# Patient Record
Sex: Male | Born: 1980 | Race: Black or African American | Hispanic: No | Marital: Single | Smoking: Current every day smoker
Health system: Southern US, Community
[De-identification: ages and names within clinical notes are randomized; demographics above are authoritative.]

## PROBLEM LIST (undated history)

## (undated) DIAGNOSIS — S73005A Unspecified dislocation of left hip, initial encounter: Secondary | ICD-10-CM

## (undated) DIAGNOSIS — S72059A Unspecified fracture of head of unspecified femur, initial encounter for closed fracture: Secondary | ICD-10-CM

## (undated) HISTORY — PX: FRACTURE SURGERY: SHX138

---

## 2017-10-29 ENCOUNTER — Other Ambulatory Visit: Payer: Self-pay

## 2017-10-29 ENCOUNTER — Emergency Department (HOSPITAL_COMMUNITY): Payer: No Typology Code available for payment source

## 2017-10-29 ENCOUNTER — Inpatient Hospital Stay (HOSPITAL_COMMUNITY)
Admission: EM | Admit: 2017-10-29 | Discharge: 2017-11-03 | DRG: 958 | Disposition: A | Discharge status: Home Health | Payer: No Typology Code available for payment source | Attending: General Surgery | Admitting: General Surgery

## 2017-10-29 ENCOUNTER — Encounter (HOSPITAL_COMMUNITY): Payer: Self-pay | Admitting: *Deleted

## 2017-10-29 DIAGNOSIS — S99919A Unspecified injury of unspecified ankle, initial encounter: Secondary | ICD-10-CM

## 2017-10-29 DIAGNOSIS — D62 Acute posthemorrhagic anemia: Secondary | ICD-10-CM | POA: Diagnosis not present

## 2017-10-29 DIAGNOSIS — T148XXA Other injury of unspecified body region, initial encounter: Secondary | ICD-10-CM

## 2017-10-29 DIAGNOSIS — S72052A Unspecified fracture of head of left femur, initial encounter for closed fracture: Secondary | ICD-10-CM | POA: Diagnosis present

## 2017-10-29 DIAGNOSIS — Z419 Encounter for procedure for purposes other than remedying health state, unspecified: Secondary | ICD-10-CM

## 2017-10-29 DIAGNOSIS — F17290 Nicotine dependence, other tobacco product, uncomplicated: Secondary | ICD-10-CM | POA: Diagnosis present

## 2017-10-29 DIAGNOSIS — S73005A Unspecified dislocation of left hip, initial encounter: Secondary | ICD-10-CM

## 2017-10-29 DIAGNOSIS — S0181XA Laceration without foreign body of other part of head, initial encounter: Secondary | ICD-10-CM

## 2017-10-29 DIAGNOSIS — Y92410 Unspecified street and highway as the place of occurrence of the external cause: Secondary | ICD-10-CM

## 2017-10-29 DIAGNOSIS — S0101XA Laceration without foreign body of scalp, initial encounter: Secondary | ICD-10-CM | POA: Diagnosis present

## 2017-10-29 DIAGNOSIS — S020XXA Fracture of vault of skull, initial encounter for closed fracture: Secondary | ICD-10-CM | POA: Diagnosis present

## 2017-10-29 DIAGNOSIS — S32402A Unspecified fracture of left acetabulum, initial encounter for closed fracture: Secondary | ICD-10-CM | POA: Diagnosis present

## 2017-10-29 DIAGNOSIS — S27322A Contusion of lung, bilateral, initial encounter: Secondary | ICD-10-CM | POA: Diagnosis present

## 2017-10-29 DIAGNOSIS — S51812A Laceration without foreign body of left forearm, initial encounter: Secondary | ICD-10-CM

## 2017-10-29 DIAGNOSIS — M25552 Pain in left hip: Secondary | ICD-10-CM | POA: Diagnosis present

## 2017-10-29 DIAGNOSIS — S72009A Fracture of unspecified part of neck of unspecified femur, initial encounter for closed fracture: Secondary | ICD-10-CM

## 2017-10-29 DIAGNOSIS — S99929A Unspecified injury of unspecified foot, initial encounter: Secondary | ICD-10-CM

## 2017-10-29 DIAGNOSIS — S72059A Unspecified fracture of head of unspecified femur, initial encounter for closed fracture: Secondary | ICD-10-CM | POA: Diagnosis present

## 2017-10-29 DIAGNOSIS — S32422A Displaced fracture of posterior wall of left acetabulum, initial encounter for closed fracture: Principal | ICD-10-CM

## 2017-10-29 HISTORY — DX: Unspecified fracture of head of unspecified femur, initial encounter for closed fracture: S72.059A

## 2017-10-29 HISTORY — DX: Unspecified dislocation of left hip, initial encounter: S73.005A

## 2017-10-29 LAB — I-STAT CHEM 8, ED
BUN: 13 mg/dL (ref 6–20)
CALCIUM ION: 1.12 mmol/L — AB (ref 1.15–1.40)
CREATININE: 1.4 mg/dL — AB (ref 0.61–1.24)
Chloride: 100 mmol/L — ABNORMAL LOW (ref 101–111)
GLUCOSE: 141 mg/dL — AB (ref 65–99)
HCT: 38 % — ABNORMAL LOW (ref 39.0–52.0)
HEMOGLOBIN: 12.9 g/dL — AB (ref 13.0–17.0)
Potassium: 3.6 mmol/L (ref 3.5–5.1)
Sodium: 142 mmol/L (ref 135–145)
TCO2: 30 mmol/L (ref 22–32)

## 2017-10-29 LAB — COMPREHENSIVE METABOLIC PANEL
ALT: 23 U/L (ref 17–63)
ANION GAP: 12 (ref 5–15)
AST: 39 U/L (ref 15–41)
Albumin: 3.7 g/dL (ref 3.5–5.0)
Alkaline Phosphatase: 48 U/L (ref 38–126)
BUN: 11 mg/dL (ref 6–20)
CHLORIDE: 102 mmol/L (ref 101–111)
CO2: 25 mmol/L (ref 22–32)
CREATININE: 1.46 mg/dL — AB (ref 0.61–1.24)
Calcium: 8.6 mg/dL — ABNORMAL LOW (ref 8.9–10.3)
Glucose, Bld: 143 mg/dL — ABNORMAL HIGH (ref 65–99)
POTASSIUM: 3.9 mmol/L (ref 3.5–5.1)
SODIUM: 139 mmol/L (ref 135–145)
Total Bilirubin: 0.9 mg/dL (ref 0.3–1.2)
Total Protein: 6.4 g/dL — ABNORMAL LOW (ref 6.5–8.1)

## 2017-10-29 LAB — ETHANOL: Alcohol, Ethyl (B): <10 mg/dL (ref <10)

## 2017-10-29 LAB — PROTIME-INR
INR: 0.95
PROTHROMBIN TIME: 12.6 s (ref 11.4–15.2)

## 2017-10-29 LAB — CBC
HCT: 38.3 % — ABNORMAL LOW (ref 39.0–52.0)
HEMOGLOBIN: 12.9 g/dL — AB (ref 13.0–17.0)
MCH: 27.6 pg (ref 26.0–34.0)
MCHC: 33.7 g/dL (ref 30.0–36.0)
MCV: 81.8 fL (ref 78.0–100.0)
PLATELETS: 204 10*3/uL (ref 150–400)
RBC: 4.68 MIL/uL (ref 4.22–5.81)
RDW: 13.2 % (ref 11.5–15.5)
WBC: 9.2 10*3/uL (ref 4.0–10.5)

## 2017-10-29 LAB — I-STAT CG4 LACTIC ACID, ED: Lactic Acid, Venous: 2.79 mmol/L (ref 0.5–1.9)

## 2017-10-29 LAB — SAMPLE TO BLOOD BANK

## 2017-10-29 MED ORDER — ETOMIDATE 2 MG/ML IV SOLN
10.0000 mg | Freq: Once | INTRAVENOUS | Status: AC
  Administered 2017-10-29: 22:00:00 via INTRAVENOUS

## 2017-10-29 MED ORDER — SODIUM CHLORIDE 0.9 % IV BOLUS (SEPSIS)
1000.0000 mL | Freq: Once | INTRAVENOUS | Status: AC
  Administered 2017-10-29: 1000 mL via INTRAVENOUS

## 2017-10-29 MED ORDER — FENTANYL CITRATE (PF) 100 MCG/2ML IJ SOLN
INTRAMUSCULAR | Status: AC
  Administered 2017-10-29: 100 ug
  Filled 2017-10-29: qty 2

## 2017-10-29 MED ORDER — IOPAMIDOL (ISOVUE-300) INJECTION 61%
INTRAVENOUS | Status: AC
  Administered 2017-10-29: 100 mL
  Filled 2017-10-29: qty 100

## 2017-10-29 MED ORDER — FENTANYL CITRATE 100 MCG BU TABS: 100.0000 ug | ORAL_TABLET | BUCCAL | Status: DC | PRN

## 2017-10-29 MED ORDER — FENTANYL CITRATE (PF) 100 MCG/2ML IJ SOLN: 100.0000 ug | INTRAMUSCULAR | Status: DC | PRN

## 2017-10-29 MED ORDER — LIDOCAINE-EPINEPHRINE (PF) 2 %-1:200000 IJ SOLN
10.0000 mL | Freq: Once | INTRAMUSCULAR | Status: AC
  Administered 2017-10-30: 10 mL

## 2017-10-29 NOTE — ED Notes (Signed)
etomadate 10mg  iv dr Rubin Payorpickering

## 2017-10-29 NOTE — ED Notes (Signed)
Etomidate 10mg  iv per dr Rubin Payorpickering  Lt hip dislocation reduced dr Rubin Payorpickering and dr Aundria Rudrogers

## 2017-10-29 NOTE — ED Notes (Signed)
Copy of lactic acid given to Dr Rubin PayorPickering 2.79

## 2017-10-29 NOTE — ED Notes (Signed)
Dr Jearld Fentonbyers here
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
## 2017-10-29 NOTE — ED Notes (Signed)
Family has  Been in to see him between his procedures

## 2017-10-29 NOTE — ED Notes (Signed)
Dr Corliss Skainstsuei back in to see

## 2017-10-29 NOTE — ED Notes (Signed)
Dr byers suturing pts lac lt face
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
## 2017-10-29 NOTE — Progress Notes (Signed)
Orthopedic Tech Progress Note Patient Details:  Cameron NailsMarvin Copeland 24-Sep-1981 [id number]  Ortho Devices Type of Ortho Device: Knee Immobilizer Ortho Device/Splint Location: lle Ortho Device/Splint Interventions: Ordered, Application, Adjustment   Post Interventions Patient Tolerated: Well Instructions Provided: Care of device, Adjustment of device Applied at drs request post reduction of hip  [name], [name] 10/29/2017, 10:55 PM
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
## 2017-10-29 NOTE — ED Notes (Signed)
Dr Aundria Rudrogers here

## 2017-10-29 NOTE — ED Notes (Signed)
Pt alert feeling better

## 2017-10-29 NOTE — ED Notes (Signed)
To ct
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
## 2017-10-30 LAB — URINALYSIS, ROUTINE W REFLEX MICROSCOPIC
BILIRUBIN URINE: NEGATIVE
Bacteria, UA: NONE SEEN
Glucose, UA: NEGATIVE mg/dL
KETONES UR: NEGATIVE mg/dL
Leukocytes, UA: NEGATIVE
Nitrite: NEGATIVE
PROTEIN: NEGATIVE mg/dL
Specific Gravity, Urine: 1.034 — ABNORMAL HIGH (ref 1.005–1.030)
pH: 6 (ref 5.0–8.0)

## 2017-10-30 LAB — BASIC METABOLIC PANEL
ANION GAP: 9 (ref 5–15)
BUN: 11 mg/dL (ref 6–20)
CO2: 22 mmol/L (ref 22–32)
Calcium: 8.2 mg/dL — ABNORMAL LOW (ref 8.9–10.3)
Chloride: 107 mmol/L (ref 101–111)
Creatinine, Ser: 1.18 mg/dL (ref 0.61–1.24)
GFR calc Af Amer: >60 mL/min (ref >60)
GLUCOSE: 109 mg/dL — AB (ref 65–99)
POTASSIUM: 4.2 mmol/L (ref 3.5–5.1)
Sodium: 138 mmol/L (ref 135–145)

## 2017-10-30 LAB — CBC
HEMATOCRIT: 33.8 % — AB (ref 39.0–52.0)
Hemoglobin: 11.3 g/dL — ABNORMAL LOW (ref 13.0–17.0)
MCH: 27.2 pg (ref 26.0–34.0)
MCHC: 33.4 g/dL (ref 30.0–36.0)
MCV: 81.4 fL (ref 78.0–100.0)
PLATELETS: 145 10*3/uL — AB (ref 150–400)
RBC: 4.15 MIL/uL — AB (ref 4.22–5.81)
RDW: 13.6 % (ref 11.5–15.5)
WBC: 10.1 10*3/uL (ref 4.0–10.5)

## 2017-10-30 LAB — SURGICAL PCR SCREEN
MRSA, PCR: NEGATIVE
STAPHYLOCOCCUS AUREUS: NEGATIVE

## 2017-10-30 LAB — CDS SEROLOGY

## 2017-10-30 LAB — HIV ANTIBODY (ROUTINE TESTING W REFLEX): HIV Screen 4th Generation wRfx: NONREACTIVE

## 2017-10-30 MED ORDER — METHOCARBAMOL 1000 MG/10ML IJ SOLN
500.0000 mg | Freq: Three times a day (TID) | INTRAVENOUS | Status: DC | PRN
  Filled 2017-10-30: qty 5

## 2017-10-30 MED ORDER — ENOXAPARIN SODIUM 40 MG/0.4ML ~~LOC~~ SOLN
40.0000 mg | SUBCUTANEOUS | Status: DC
  Administered 2017-10-30: 40 mg via SUBCUTANEOUS
  Filled 2017-10-30: qty 0.4

## 2017-10-30 MED ORDER — ONDANSETRON HCL 4 MG/2ML IJ SOLN: 4.0000 mg | Freq: Four times a day (QID) | INTRAMUSCULAR | Status: DC | PRN

## 2017-10-30 MED ORDER — HYDROMORPHONE HCL 1 MG/ML IJ SOLN: 0.5000 mg | INTRAMUSCULAR | Status: DC | PRN

## 2017-10-30 MED ORDER — HYDROMORPHONE HCL 1 MG/ML IJ SOLN
1.0000 mg | INTRAMUSCULAR | Status: DC | PRN
  Administered 2017-10-30 (×3): 1 mg via INTRAVENOUS
  Filled 2017-10-30 (×3): qty 1

## 2017-10-30 MED ORDER — KCL IN DEXTROSE-NACL 20-5-0.45 MEQ/L-%-% IV SOLN
INTRAVENOUS | Status: DC
  Administered 2017-10-30 – 2017-11-01 (×3): via INTRAVENOUS
  Filled 2017-10-30 (×4): qty 1000

## 2017-10-30 MED ORDER — ONDANSETRON 4 MG PO TBDP
4.0000 mg | ORAL_TABLET | Freq: Four times a day (QID) | ORAL | Status: DC | PRN
  Filled 2017-10-30: qty 1

## 2017-10-30 MED ORDER — SODIUM CHLORIDE 0.9 % IV SOLN
INTRAVENOUS | Status: DC
  Administered 2017-10-30: 02:00:00 via INTRAVENOUS

## 2017-10-30 NOTE — ED Notes (Signed)
Knee immobilizer placed on his lt leg following reduction of his lt hip

## 2017-10-30 NOTE — Progress Notes (Signed)
Orthopedic Tech Progress Note Patient Details:  Cameron Copeland 02-28-1981 [id number]  Musculoskeletal Traction Type of Traction: Bucks Skin Traction Traction Weight: 10 lbs   Post Interventions Patient Tolerated: Well Instructions Provided: Care of device   [name] 10/30/2017, 1:41 PM
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
## 2017-10-30 NOTE — ED Notes (Signed)
Dr piockering just finished suturing his lt elbow and dr byers just finished suturing the laxceration to the lt side of his face

## 2017-10-30 NOTE — Progress Notes (Signed)
Subjective:    Patient reports pain as mild to left hip. Left elbow soreness related to laceration.  Resting well in bed. Tolerating Brace. Denies sob, cp, or calf pain.  Objective: Vital signs in last 24 hours: Temp:  [98.7 F (37.1 C)-99.8 F (37.7 C)] 98.9 F (37.2 C) (01/13 0620) Pulse Rate:  [61-125] 83 (01/13 0620) Resp:  [14-25] 16 (01/13 0620) BP: (105-152)/(71-128) 129/82 (01/13 0620) SpO2:  [95 %-100 %] 100 % (01/13 0620) Weight:  [72.6 kg (160 lb)] 72.6 kg (160 lb) (01/12 2109)  Intake/Output from previous day: 01/12 0701 - 01/13 0700 In: 429.2 [I.V.:429.2] Out: 650 [Urine:650] Intake/Output this shift: No intake/output data recorded.  Recent Labs    10/29/17 2106 10/29/17 2114 10/30/17 0554  HGB 12.9* 12.9* 11.3*   Recent Labs    10/29/17 2106 10/29/17 2114 10/30/17 0554  WBC 9.2  --  10.1  RBC 4.68  --  4.15*  HCT 38.3* 38.0* 33.8*  PLT 204  --  145*   Recent Labs    10/29/17 2106 10/29/17 2114 10/30/17 0554  NA 139 142 138  K 3.9 3.6 4.2  CL 102 100* 107  CO2 25  --  22  BUN 11 13 11   CREATININE 1.46* 1.40* 1.18  GLUCOSE 143* 141* 109*  CALCIUM 8.6*  --  8.2*   Recent Labs    10/29/17 2106  INR 0.95    Alert and oriented x3. RRR, Lungs clear, BS x4. Left Calf soft and non tender.  No DVT signs. No signs of infection or compartment syndrome. LLE grossly neurovascularly intact. Plantar and dorsi flexion in tact. Sensation intact. 2+ pedal pulse. Brace in place.   Assessment/Plan:   Left acetabular fracture: NWB left leg Remain in knee immobilizer Dr.Rogers to discuss with traumatology possible surgery tomorrow NPO MN Diet now SCd's Discussed with Dr.Rogers Pain mangement   Benedicto Capozzi L 10/30/2017, 8:54 AM

## 2017-10-30 NOTE — ED Notes (Signed)
Pain only with movement now

## 2017-10-30 NOTE — Anesthesia Preprocedure Evaluation (Addendum)
Anesthesia Evaluation  Patient identified by MRN, date of birth, ID band Patient awake    Reviewed: Allergy & Precautions, NPO status , Patient's Chart, lab work & pertinent test results  Airway Mallampati: III  TM Distance: >3 FB Neck ROM: Full    Dental  (+) Dental Advisory Given   Pulmonary Current Smoker,  pulm contusions    breath sounds clear to auscultation       Cardiovascular negative cardio ROS   Rhythm:Regular Rate:Normal     Neuro/Psych Skull fx    GI/Hepatic negative GI ROS, Neg liver ROS,   Endo/Other  negative endocrine ROS  Renal/GU Renal InsufficiencyRenal disease     Musculoskeletal   Abdominal   Peds  Hematology  (+) anemia ,   Anesthesia Other Findings   Reproductive/Obstetrics                            Lab Results  Component Value Date   WBC 10.1 10/30/2017   HGB 11.3 (L) 10/30/2017   HCT 33.8 (L) 10/30/2017   MCV 81.4 10/30/2017   PLT 145 (L) 10/30/2017   Lab Results  Component Value Date   CREATININE 1.18 10/30/2017   BUN 11 10/30/2017   NA 138 10/30/2017   K 4.2 10/30/2017   CL 107 10/30/2017   CO2 22 10/30/2017    Anesthesia Physical Anesthesia Plan  ASA: II  Anesthesia Plan: General   Post-op Pain Management:    Induction: Intravenous  PONV Risk Score and Plan: 2 and Ondansetron, Dexamethasone and Treatment may vary due to age or medical condition  Airway Management Planned: Oral ETT  Additional Equipment:   Intra-op Plan:   Post-operative Plan: Extubation in OR  Informed Consent: I have reviewed the patients History and Physical, chart, labs and discussed the procedure including the risks, benefits and alternatives for the proposed anesthesia with the patient or authorized representative who has indicated his/her understanding and acceptance.   Dental advisory given  Plan Discussed with: CRNA  Anesthesia Plan Comments:         Anesthesia Quick Evaluation
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
## 2017-10-31 ENCOUNTER — Inpatient Hospital Stay (HOSPITAL_COMMUNITY): Payer: No Typology Code available for payment source | Admitting: Anesthesiology

## 2017-10-31 ENCOUNTER — Inpatient Hospital Stay (HOSPITAL_COMMUNITY): Payer: No Typology Code available for payment source

## 2017-10-31 ENCOUNTER — Encounter (HOSPITAL_COMMUNITY): Admission: EM | Disposition: A | Discharge status: Home Health | Payer: Self-pay | Source: Home / Self Care

## 2017-10-31 ENCOUNTER — Encounter (HOSPITAL_COMMUNITY): Payer: Self-pay

## 2017-10-31 ENCOUNTER — Telehealth: Payer: Self-pay | Admitting: Oncology

## 2017-10-31 HISTORY — PX: ORIF ACETABULAR FRACTURE: SHX5029

## 2017-10-31 LAB — CBC
HCT: 31.4 % — ABNORMAL LOW (ref 39.0–52.0)
HEMOGLOBIN: 10.7 g/dL — AB (ref 13.0–17.0)
MCH: 27.7 pg (ref 26.0–34.0)
MCHC: 34.1 g/dL (ref 30.0–36.0)
MCV: 81.3 fL (ref 78.0–100.0)
Platelets: 133 10*3/uL — ABNORMAL LOW (ref 150–400)
RBC: 3.86 MIL/uL — ABNORMAL LOW (ref 4.22–5.81)
RDW: 13.5 % (ref 11.5–15.5)
WBC: 9.3 10*3/uL (ref 4.0–10.5)

## 2017-10-31 LAB — CREATININE, SERUM: Creatinine, Ser: 1.12 mg/dL (ref 0.61–1.24)

## 2017-10-31 SURGERY — OPEN REDUCTION INTERNAL FIXATION (ORIF) ACETABULAR FRACTURE
Anesthesia: General | Site: Hip | Laterality: Left

## 2017-10-31 MED ORDER — LIDOCAINE 2% (20 MG/ML) 5 ML SYRINGE
INTRAMUSCULAR | Status: AC
  Filled 2017-10-31: qty 5

## 2017-10-31 MED ORDER — SUGAMMADEX SODIUM 200 MG/2ML IV SOLN
INTRAVENOUS | Status: AC
  Filled 2017-10-31: qty 2

## 2017-10-31 MED ORDER — ACETAMINOPHEN 650 MG RE SUPP: 650.0000 mg | RECTAL | Status: DC | PRN

## 2017-10-31 MED ORDER — ONDANSETRON HCL 4 MG/2ML IJ SOLN
INTRAMUSCULAR | Status: DC | PRN
  Administered 2017-10-31: 4 mg via INTRAVENOUS

## 2017-10-31 MED ORDER — METOCLOPRAMIDE HCL 5 MG PO TABS: 5.0000 mg | ORAL_TABLET | Freq: Three times a day (TID) | ORAL | Status: DC | PRN

## 2017-10-31 MED ORDER — DOCUSATE SODIUM 100 MG PO CAPS
100.0000 mg | ORAL_CAPSULE | Freq: Two times a day (BID) | ORAL | Status: DC
  Administered 2017-10-31 – 2017-11-03 (×7): 100 mg via ORAL
  Filled 2017-10-31 (×7): qty 1

## 2017-10-31 MED ORDER — MAGNESIUM CITRATE PO SOLN: 1.0000 | Freq: Once | ORAL | Status: DC | PRN

## 2017-10-31 MED ORDER — SUGAMMADEX SODIUM 200 MG/2ML IV SOLN
INTRAVENOUS | Status: DC | PRN
  Administered 2017-10-31: 200 mg via INTRAVENOUS

## 2017-10-31 MED ORDER — ENOXAPARIN SODIUM 40 MG/0.4ML ~~LOC~~ SOLN
40.0000 mg | SUBCUTANEOUS | Status: DC
  Administered 2017-11-01 – 2017-11-03 (×3): 40 mg via SUBCUTANEOUS
  Filled 2017-10-31 (×3): qty 0.4

## 2017-10-31 MED ORDER — CEFAZOLIN SODIUM-DEXTROSE 2-3 GM-%(50ML) IV SOLR
INTRAVENOUS | Status: DC | PRN
  Administered 2017-10-31: 2 g via INTRAVENOUS

## 2017-10-31 MED ORDER — METHOCARBAMOL 500 MG PO TABS
500.0000 mg | ORAL_TABLET | Freq: Four times a day (QID) | ORAL | Status: DC | PRN
  Administered 2017-11-01 – 2017-11-03 (×3): 500 mg via ORAL
  Filled 2017-10-31 (×3): qty 1

## 2017-10-31 MED ORDER — ACETAMINOPHEN 325 MG PO TABS: 650.0000 mg | ORAL_TABLET | ORAL | Status: DC | PRN

## 2017-10-31 MED ORDER — PROPOFOL 10 MG/ML IV BOLUS
INTRAVENOUS | Status: DC | PRN
  Administered 2017-10-31: 200 mg via INTRAVENOUS

## 2017-10-31 MED ORDER — DEXAMETHASONE SODIUM PHOSPHATE 10 MG/ML IJ SOLN
INTRAMUSCULAR | Status: DC | PRN
  Administered 2017-10-31: 10 mg via INTRAVENOUS

## 2017-10-31 MED ORDER — MIDAZOLAM HCL 5 MG/5ML IJ SOLN
INTRAMUSCULAR | Status: DC | PRN
  Administered 2017-10-31: 2 mg via INTRAVENOUS

## 2017-10-31 MED ORDER — HYDROMORPHONE HCL 1 MG/ML IJ SOLN
INTRAMUSCULAR | Status: AC
  Filled 2017-10-31: qty 1

## 2017-10-31 MED ORDER — POLYETHYLENE GLYCOL 3350 17 G PO PACK
17.0000 g | PACK | Freq: Every day | ORAL | Status: DC
  Administered 2017-11-01 – 2017-11-03 (×3): 17 g via ORAL
  Filled 2017-10-31 (×3): qty 1

## 2017-10-31 MED ORDER — ONDANSETRON HCL 4 MG/2ML IJ SOLN: 4.0000 mg | Freq: Once | INTRAMUSCULAR | Status: DC | PRN

## 2017-10-31 MED ORDER — PROPOFOL 10 MG/ML IV BOLUS
INTRAVENOUS | Status: AC
  Filled 2017-10-31: qty 20

## 2017-10-31 MED ORDER — METOCLOPRAMIDE HCL 5 MG/ML IJ SOLN: 5.0000 mg | Freq: Three times a day (TID) | INTRAMUSCULAR | Status: DC | PRN

## 2017-10-31 MED ORDER — FENTANYL CITRATE (PF) 250 MCG/5ML IJ SOLN
INTRAMUSCULAR | Status: AC
  Filled 2017-10-31: qty 5

## 2017-10-31 MED ORDER — 0.9 % SODIUM CHLORIDE (POUR BTL) OPTIME
TOPICAL | Status: DC | PRN
  Administered 2017-10-31: 1000 mL

## 2017-10-31 MED ORDER — ONDANSETRON HCL 4 MG/2ML IJ SOLN
INTRAMUSCULAR | Status: AC
  Filled 2017-10-31: qty 2

## 2017-10-31 MED ORDER — FENTANYL CITRATE (PF) 100 MCG/2ML IJ SOLN
INTRAMUSCULAR | Status: DC | PRN
  Administered 2017-10-31: 50 ug via INTRAVENOUS
  Administered 2017-10-31 (×2): 100 ug via INTRAVENOUS

## 2017-10-31 MED ORDER — OXYCODONE-ACETAMINOPHEN 5-325 MG PO TABS
1.0000 | ORAL_TABLET | Freq: Four times a day (QID) | ORAL | Status: DC | PRN
  Administered 2017-10-31 – 2017-11-02 (×7): 1 via ORAL
  Administered 2017-11-02 – 2017-11-03 (×3): 2 via ORAL
  Filled 2017-10-31: qty 2
  Filled 2017-10-31 (×3): qty 1
  Filled 2017-10-31: qty 2
  Filled 2017-10-31 (×3): qty 1
  Filled 2017-10-31: qty 2
  Filled 2017-10-31: qty 1

## 2017-10-31 MED ORDER — DEXTROSE 5 % IV SOLN
1000.0000 mg | Freq: Four times a day (QID) | INTRAVENOUS | Status: DC | PRN
  Filled 2017-10-31: qty 10

## 2017-10-31 MED ORDER — ONDANSETRON HCL 4 MG/2ML IJ SOLN: 4.0000 mg | Freq: Four times a day (QID) | INTRAMUSCULAR | Status: DC | PRN

## 2017-10-31 MED ORDER — CEFAZOLIN SODIUM-DEXTROSE 2-4 GM/100ML-% IV SOLN
INTRAVENOUS | Status: AC
  Filled 2017-10-31: qty 100

## 2017-10-31 MED ORDER — DEXAMETHASONE SODIUM PHOSPHATE 10 MG/ML IJ SOLN
INTRAMUSCULAR | Status: AC
  Filled 2017-10-31: qty 1

## 2017-10-31 MED ORDER — HYDROMORPHONE HCL 1 MG/ML IJ SOLN
0.2500 mg | INTRAMUSCULAR | Status: DC | PRN
  Administered 2017-10-31: 0.5 mg via INTRAVENOUS

## 2017-10-31 MED ORDER — LIDOCAINE 2% (20 MG/ML) 5 ML SYRINGE
INTRAMUSCULAR | Status: DC | PRN
  Administered 2017-10-31: 100 mg via INTRAVENOUS

## 2017-10-31 MED ORDER — ONDANSETRON HCL 4 MG PO TABS
4.0000 mg | ORAL_TABLET | Freq: Four times a day (QID) | ORAL | Status: DC | PRN
Start: 2017-10-31 — End: 2017-11-03

## 2017-10-31 MED ORDER — MIDAZOLAM HCL 2 MG/2ML IJ SOLN
INTRAMUSCULAR | Status: AC
  Filled 2017-10-31: qty 2

## 2017-10-31 MED ORDER — CEFAZOLIN SODIUM-DEXTROSE 1-4 GM/50ML-% IV SOLN
1.0000 g | Freq: Four times a day (QID) | INTRAVENOUS | Status: AC
  Administered 2017-10-31 – 2017-11-01 (×3): 1 g via INTRAVENOUS
  Filled 2017-10-31 (×3): qty 50

## 2017-10-31 MED ORDER — LACTATED RINGERS IV SOLN
INTRAVENOUS | Status: DC
  Administered 2017-10-31 (×2): via INTRAVENOUS

## 2017-10-31 MED ORDER — OXYCODONE HCL 5 MG PO TABS: 5.0000 mg | ORAL_TABLET | ORAL | Status: DC | PRN

## 2017-10-31 MED ORDER — ROCURONIUM BROMIDE 10 MG/ML (PF) SYRINGE
PREFILLED_SYRINGE | INTRAVENOUS | Status: DC | PRN
  Administered 2017-10-31: 10 mg via INTRAVENOUS
  Administered 2017-10-31: 20 mg via INTRAVENOUS
  Administered 2017-10-31: 10 mg via INTRAVENOUS
  Administered 2017-10-31: 50 mg via INTRAVENOUS
  Administered 2017-10-31: 10 mg via INTRAVENOUS

## 2017-10-31 MED ORDER — BISACODYL 5 MG PO TBEC: 5.0000 mg | DELAYED_RELEASE_TABLET | Freq: Every day | ORAL | Status: DC | PRN

## 2017-10-31 SURGICAL SUPPLY — 72 items
ANCHOR JUGGERKNOT WTAP NDL 2.9 (Anchor) ×3 IMPLANT
BIT DRILL AO MATTA 2.5MX230M (BIT) ×1 IMPLANT
BIT DRILL JUGRKNT W/NDL BIT2.9 (DRILL) ×1 IMPLANT
BIT DRILL STEP 3.5 (DRILL) IMPLANT
BIT DRILL TWST MATTA 3.5MX195M (BIT) ×1 IMPLANT
BLADE CLIPPER SURG (BLADE) IMPLANT
BRUSH SCRUB SURG 4.25 DISP (MISCELLANEOUS) ×6 IMPLANT
CLOSURE WOUND 1/2 X4 (GAUZE/BANDAGES/DRESSINGS) ×1
COVER SURGICAL LIGHT HANDLE (MISCELLANEOUS) ×3 IMPLANT
DRAPE C-ARM 42X72 X-RAY (DRAPES) ×3 IMPLANT
DRAPE C-ARMOR (DRAPES) ×3 IMPLANT
DRAPE INCISE IOBAN 66X45 STRL (DRAPES) ×3 IMPLANT
DRAPE INCISE IOBAN 85X60 (DRAPES) ×3 IMPLANT
DRAPE ORTHO SPLIT 77X108 STRL (DRAPES) ×4
DRAPE SURG ORHT 6 SPLT 77X108 (DRAPES) ×2 IMPLANT
DRAPE U-SHAPE 47X51 STRL (DRAPES) ×3 IMPLANT
DRILL BIT AO MATTA 2.5MX230M (BIT) ×3
DRILL JUGGERKNOT W/NDL BIT 2.9 (DRILL) ×3
DRILL STEP 3.5 (DRILL)
DRILL TWIST AO MATTA 3.5MX195M (BIT) ×3
DRSG MEPILEX BORDER 4X12 (GAUZE/BANDAGES/DRESSINGS) ×3 IMPLANT
DRSG MEPILEX BORDER 4X8 (GAUZE/BANDAGES/DRESSINGS) IMPLANT
ELECT BLADE 6.5 EXT (BLADE) ×3 IMPLANT
ELECT REM PT RETURN 9FT ADLT (ELECTROSURGICAL) ×3
ELECTRODE REM PT RTRN 9FT ADLT (ELECTROSURGICAL) ×1 IMPLANT
GLOVE BIO SURGEON STRL SZ7.5 (GLOVE) ×3 IMPLANT
GLOVE BIO SURGEON STRL SZ8 (GLOVE) ×3 IMPLANT
GLOVE BIOGEL PI IND STRL 7.5 (GLOVE) ×1 IMPLANT
GLOVE BIOGEL PI IND STRL 8 (GLOVE) ×1 IMPLANT
GLOVE BIOGEL PI INDICATOR 7.5 (GLOVE) ×2
GLOVE BIOGEL PI INDICATOR 8 (GLOVE) ×2
GOWN STRL REUS W/ TWL LRG LVL3 (GOWN DISPOSABLE) ×2 IMPLANT
GOWN STRL REUS W/ TWL XL LVL3 (GOWN DISPOSABLE) ×2 IMPLANT
GOWN STRL REUS W/TWL LRG LVL3 (GOWN DISPOSABLE) ×4
GOWN STRL REUS W/TWL XL LVL3 (GOWN DISPOSABLE) ×4
HANDPIECE INTERPULSE COAX TIP (DISPOSABLE) ×2
KIT BASIN OR (CUSTOM PROCEDURE TRAY) ×3 IMPLANT
KIT ROOM TURNOVER OR (KITS) ×3 IMPLANT
MANIFOLD NEPTUNE II (INSTRUMENTS) ×3 IMPLANT
NS IRRIG 1000ML POUR BTL (IV SOLUTION) ×3 IMPLANT
PACK TOTAL JOINT (CUSTOM PROCEDURE TRAY) ×3 IMPLANT
PAD ARMBOARD 7.5X6 YLW CONV (MISCELLANEOUS) ×6 IMPLANT
PIN APEX 6X180MM EXFIX (EXFIX) ×3 IMPLANT
PLATE TUBULAR 1/3 5H (Plate) ×6 IMPLANT
RETRIEVER SUT HEWSON (MISCELLANEOUS) ×3 IMPLANT
SCREW CORTEX ST MATTA 3.5X20 (Screw) ×3 IMPLANT
SCREW CORTEX ST MATTA 3.5X22MM (Screw) ×3 IMPLANT
SCREW CORTEX ST MATTA 3.5X32MM (Screw) ×6 IMPLANT
SCREW CORTEX ST MATTA 3.5X36MM (Screw) ×6 IMPLANT
SCREW CORTEX ST MATTA 3.5X38M (Screw) ×3 IMPLANT
SCREW CORTEX ST MATTA 3.5X40MM (Screw) ×6 IMPLANT
SET HNDPC FAN SPRY TIP SCT (DISPOSABLE) ×1 IMPLANT
SPONGE LAP 18X18 X RAY DECT (DISPOSABLE) IMPLANT
STAPLER VISISTAT 35W (STAPLE) ×3 IMPLANT
STRIP CLOSURE SKIN 1/2X4 (GAUZE/BANDAGES/DRESSINGS) ×2 IMPLANT
SUCTION FRAZIER HANDLE 10FR (MISCELLANEOUS) ×2
SUCTION TUBE FRAZIER 10FR DISP (MISCELLANEOUS) ×1 IMPLANT
SUT ETHILON 2 0 PSLX (SUTURE) ×6 IMPLANT
SUT FIBERWIRE #2 38 T-5 BLUE (SUTURE) ×6
SUT VIC AB 0 CT1 27 (SUTURE) ×2
SUT VIC AB 0 CT1 27XBRD ANBCTR (SUTURE) ×1 IMPLANT
SUT VIC AB 1 CT1 18XCR BRD 8 (SUTURE) ×2 IMPLANT
SUT VIC AB 1 CT1 27 (SUTURE) ×2
SUT VIC AB 1 CT1 27XBRD ANBCTR (SUTURE) ×1 IMPLANT
SUT VIC AB 1 CT1 8-18 (SUTURE) ×4
SUT VIC AB 2-0 CT1 27 (SUTURE) ×4
SUT VIC AB 2-0 CT1 TAPERPNT 27 (SUTURE) ×2 IMPLANT
SUTURE FIBERWR #2 38 T-5 BLUE (SUTURE) ×2 IMPLANT
TOWEL OR 17X24 6PK STRL BLUE (TOWEL DISPOSABLE) ×6 IMPLANT
TOWEL OR 17X26 10 PK STRL BLUE (TOWEL DISPOSABLE) ×6 IMPLANT
TRAY FOLEY W/METER SILVER 16FR (SET/KITS/TRAYS/PACK) IMPLANT
WATER STERILE IRR 1000ML POUR (IV SOLUTION) IMPLANT
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
## 2017-10-31 NOTE — Anesthesia Procedure Notes (Signed)
Procedure Name: Intubation Date/Time: 10/31/2017 8:21 AM Performed by: Moshe Salisbury, CRNA Pre-anesthesia Checklist: Patient identified, Emergency Drugs available, Suction available and Patient being monitored Patient Re-evaluated:Patient Re-evaluated prior to induction Oxygen Delivery Method: Circle System Utilized Preoxygenation: Pre-oxygenation with 100% oxygen Induction Type: IV induction Ventilation: Mask ventilation without difficulty Laryngoscope Size: Mac and 4 Grade View: Grade II Tube type: Oral Tube size: 8.0 mm Number of attempts: 1 Airway Equipment and Method: Stylet Placement Confirmation: ETT inserted through vocal cords under direct vision,  positive ETCO2 and breath sounds checked- equal and bilateral Secured at: 23 cm Tube secured with: Tape Dental Injury: Teeth and Oropharynx as per pre-operative assessment

## 2017-10-31 NOTE — Transfer of Care (Signed)
Immediate Anesthesia Transfer of Care Note  Patient: Purcell NailsMarvin Blahut  Procedure(s) Performed: OPEN REDUCTION INTERNAL FIXATION (ORIF) LEFT POSTERIOR WALL ACETABULAR FRACTURE WITH PARTIAL EXCISION OF FEMORAL HEAD (Left Hip)  Patient Location: PACU  Anesthesia Type:General  Level of Consciousness: awake, alert  and oriented  Airway & Oxygen Therapy: Patient Spontanous Breathing and Patient connected to nasal cannula oxygen  Post-op Assessment: Report given to RN, Post -op Vital signs reviewed and stable and Patient moving all extremities X 4  Post vital signs: Reviewed and stable  Last Vitals:  Vitals:   10/31/17 0555 10/31/17 1200  BP: 132/87   Pulse: 97   Resp: 16   Temp: 37.1 C 36.9 C  SpO2: 99%     Last Pain:  Vitals:   10/31/17 1200  TempSrc:   PainSc: Asleep      Patients Stated Pain Goal: 2 (10/30/17 1331)  Complications: No apparent anesthesia complications

## 2017-10-31 NOTE — Plan of Care (Signed)
  Progressing Education: Knowledge of General Education information will improve 10/31/2017 1320 - Progressing by Quentin CornwallMadison, Sophiya Morello, RN Health Behavior/Discharge Planning: Ability to manage health-related needs will improve 10/31/2017 1320 - Progressing by Quentin CornwallMadison, Nikkol Pai, RN Clinical Measurements: Ability to maintain clinical measurements within normal limits will improve 10/31/2017 1320 - Progressing by Quentin CornwallMadison, Emmani Lesueur, RN Will remain free from infection 10/31/2017 1320 - Progressing by Quentin CornwallMadison, Sanyah Molnar, RN Diagnostic test results will improve 10/31/2017 1320 - Progressing by Quentin CornwallMadison, Westlyn Glaza, RN Respiratory complications will improve 10/31/2017 1320 - Progressing by Quentin CornwallMadison, Analiyah Lechuga, RN Cardiovascular complication will be avoided 10/31/2017 1320 - Progressing by Quentin CornwallMadison, Shizuye Rupert, RN Activity: Risk for activity intolerance will decrease 10/31/2017 1320 - Progressing by Quentin CornwallMadison, Marvene Strohm, RN Nutrition: Adequate nutrition will be maintained 10/31/2017 1320 - Progressing by Quentin CornwallMadison, Coleson Kant, RN Coping: Level of anxiety will decrease 10/31/2017 1320 - Progressing by Quentin CornwallMadison, Keaira Whitehurst, RN Elimination: Will not experience complications related to bowel motility 10/31/2017 1320 - Progressing by Quentin CornwallMadison, Erma Raiche, RN Will not experience complications related to urinary retention 10/31/2017 1320 - Progressing by Quentin CornwallMadison, Challen Spainhour, RN Pain Managment: General experience of comfort will improve 10/31/2017 1320 - Progressing by Quentin CornwallMadison, Tremon Sainvil, RN Safety: Ability to remain free from injury will improve 10/31/2017 1320 - Progressing by Quentin CornwallMadison, Seaton Hofmann, RN Skin Integrity: Risk for impaired skin integrity will decrease 10/31/2017 1320 - Progressing by Quentin CornwallMadison, Branna Cortina, RN

## 2017-10-31 NOTE — Anesthesia Postprocedure Evaluation (Signed)
Anesthesia Post Note  Patient: Cameron Copeland  Procedure(s) Performed: OPEN REDUCTION INTERNAL FIXATION (ORIF) LEFT POSTERIOR WALL ACETABULAR FRACTURE WITH PARTIAL EXCISION OF FEMORAL HEAD (Left Hip)     Patient location during evaluation: PACU Anesthesia Type: General Level of consciousness: awake and alert Pain management: pain level controlled Vital Signs Assessment: post-procedure vital signs reviewed and stable Respiratory status: spontaneous breathing, nonlabored ventilation and respiratory function stable Cardiovascular status: blood pressure returned to baseline and stable Postop Assessment: no apparent nausea or vomiting Anesthetic complications: no    Last Vitals:  Vitals:   10/31/17 1244 10/31/17 1253  BP: 139/87 (!) 125/99  Pulse: (!) 108 95  Resp: 16 18  Temp:  36.7 C  SpO2: 99% 97%    Last Pain:  Vitals:   10/31/17 1253  TempSrc:   PainSc: 4                  Cameron Copeland

## 2017-10-31 NOTE — Telephone Encounter (Signed)
   Pre-Radiation Note:  Inpatient nurse name: Ginger, RN  Time Called: 3:38 PM  Inpatient nurse to call [facility]: spoke to Darl PikesSusan, Case Manager who said they will have it arranged to get patient to Radiation Oncology at 2:30 pm tomorrow.  [facility] called to verify transportation: Yes.   Time: 3:47 PM  Consent:    Is patient able to sign consent: Yes.        Eduardo OsierHess, Karen R, RN 10/31/2017,3:38 PM
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
## 2017-11-01 ENCOUNTER — Ambulatory Visit
Admit: 2017-11-01 | Discharge: 2017-11-01 | Disposition: A | Discharge status: Home / Self Care | Payer: No Typology Code available for payment source | Attending: Radiation Oncology | Admitting: Radiation Oncology

## 2017-11-01 ENCOUNTER — Encounter (HOSPITAL_COMMUNITY): Payer: Self-pay | Admitting: Orthopedic Surgery

## 2017-11-01 DIAGNOSIS — S73005A Unspecified dislocation of left hip, initial encounter: Secondary | ICD-10-CM

## 2017-11-01 DIAGNOSIS — S72059A Unspecified fracture of head of unspecified femur, initial encounter for closed fracture: Secondary | ICD-10-CM | POA: Diagnosis present

## 2017-11-01 DIAGNOSIS — M898X9 Other specified disorders of bone, unspecified site: Secondary | ICD-10-CM

## 2017-11-01 HISTORY — DX: Unspecified dislocation of left hip, initial encounter: S73.005A

## 2017-11-01 HISTORY — DX: Unspecified fracture of head of unspecified femur, initial encounter for closed fracture: S72.059A

## 2017-11-01 LAB — CBC
HEMATOCRIT: 28.1 % — AB (ref 39.0–52.0)
Hemoglobin: 9.6 g/dL — ABNORMAL LOW (ref 13.0–17.0)
MCH: 27.4 pg (ref 26.0–34.0)
MCHC: 34.2 g/dL (ref 30.0–36.0)
MCV: 80.3 fL (ref 78.0–100.0)
Platelets: 135 10*3/uL — ABNORMAL LOW (ref 150–400)
RBC: 3.5 MIL/uL — ABNORMAL LOW (ref 4.22–5.81)
RDW: 13.1 % (ref 11.5–15.5)
WBC: 10.5 10*3/uL (ref 4.0–10.5)

## 2017-11-01 LAB — BASIC METABOLIC PANEL
Anion gap: 10 (ref 5–15)
BUN: 9 mg/dL (ref 6–20)
CALCIUM: 8.5 mg/dL — AB (ref 8.9–10.3)
CO2: 25 mmol/L (ref 22–32)
Chloride: 103 mmol/L (ref 101–111)
Creatinine, Ser: 1.11 mg/dL (ref 0.61–1.24)
GFR calc Af Amer: >60 mL/min (ref >60)
GLUCOSE: 109 mg/dL — AB (ref 65–99)
POTASSIUM: 4.2 mmol/L (ref 3.5–5.1)
Sodium: 138 mmol/L (ref 135–145)
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
## 2017-11-01 NOTE — Progress Notes (Signed)
Spoke with Mr. Daltons nurse this morning Tonie Griffithranace ,RN   Informed her that he has an appointment to today @230pm  [facility].Asked if she could medicate him before he gets here. Informed her that she needs to call [facility] for transport.
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
## 2017-11-01 NOTE — Progress Notes (Signed)
Orthopedic Tech Progress Note Patient Details:  Purcell NailsMarvin Szczerba [birth year] [id number] Overhead frame applied  Patient ID: Purcell NailsMarvin Conti, male   DOB: [birth year], 37 y.o.   MRN: [id number]   Alvina ChouWilliams, Devontre Siedschlag C 11/01/2017, 1:27 PM
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
## 2017-11-02 ENCOUNTER — Encounter (HOSPITAL_COMMUNITY): Payer: Self-pay | Admitting: General Practice

## 2017-11-02 DIAGNOSIS — M898X9 Other specified disorders of bone, unspecified site: Secondary | ICD-10-CM | POA: Insufficient documentation

## 2017-11-02 LAB — CBC
HCT: 27.8 % — ABNORMAL LOW (ref 39.0–52.0)
Hemoglobin: 9.4 g/dL — ABNORMAL LOW (ref 13.0–17.0)
MCH: 27.2 pg (ref 26.0–34.0)
MCHC: 33.8 g/dL (ref 30.0–36.0)
MCV: 80.6 fL (ref 78.0–100.0)
Platelets: 161 10*3/uL (ref 150–400)
RBC: 3.45 MIL/uL — AB (ref 4.22–5.81)
RDW: 13.1 % (ref 11.5–15.5)
WBC: 6.9 10*3/uL (ref 4.0–10.5)

## 2017-11-02 LAB — BASIC METABOLIC PANEL
Anion gap: 10 (ref 5–15)
BUN: 11 mg/dL (ref 6–20)
CHLORIDE: 102 mmol/L (ref 101–111)
CO2: 24 mmol/L (ref 22–32)
CREATININE: 1.07 mg/dL (ref 0.61–1.24)
Calcium: 8.4 mg/dL — ABNORMAL LOW (ref 8.9–10.3)
GFR calc Af Amer: >60 mL/min (ref >60)
GFR calc non Af Amer: >60 mL/min (ref >60)
GLUCOSE: 100 mg/dL — AB (ref 65–99)
Potassium: 4 mmol/L (ref 3.5–5.1)
Sodium: 136 mmol/L (ref 135–145)

## 2017-11-02 MED ORDER — HYDROMORPHONE HCL 1 MG/ML IJ SOLN: 0.5000 mg | INTRAMUSCULAR | Status: DC | PRN

## 2017-11-02 MED ORDER — ENOXAPARIN SODIUM 40 MG/0.4ML ~~LOC~~ SOLN: 40.0000 mg | SUBCUTANEOUS | 0 refills | Status: AC

## 2017-11-02 MED ORDER — METHOCARBAMOL 500 MG PO TABS: 500.0000 mg | ORAL_TABLET | Freq: Four times a day (QID) | ORAL | 0 refills | Status: AC | PRN

## 2017-11-02 MED ORDER — OXYCODONE-ACETAMINOPHEN 5-325 MG PO TABS: 1.0000 | ORAL_TABLET | Freq: Four times a day (QID) | ORAL | 0 refills | Status: AC | PRN
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
## 2017-11-02 NOTE — Progress Notes (Signed)
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 COMPARISON:  None. FINDINGS: CT HEAD FINDINGS Brain: No acute territorial infarction, hemorrhage or intracranial mass is visualized. The ventricles are nonenlarged. Vascular: No hyperdense vessels.  No unexpected calcification. Skull: Linear lucency at the left frontal bone, suspect nondisplaced linear skull fracture. Other: Large soft tissue laceration involving the left frontal scalp and extending to the soft tissues of the left lateral orbital region and cheek. Laceration is deep and extends to the outer calvarial surface superiorly. CT MAXILLOFACIAL FINDINGS Osseous: Bilateral mandibular heads are normally position. The mastoid air cells are clear. Pterygoid plates and zygomatic arches are intact. No nasal bone fracture. Prominent lucency adjacent to the root of the left maxillary central incisor. Orbits: Negative. No traumatic or inflammatory finding. Punctate calcification along the left optic nerve. Sinuses: Minimal mucosal thickening in the maxillary sinuses. No acute fluid level or sinus wall fracture Soft tissues: Large soft tissue laceration lateral to the left orbit and extending to the left cheek. CT CERVICAL SPINE FINDINGS Alignment: Straightening of the cervical spine. No subluxation. Facet alignment within normal  limits. Skull base and vertebrae: No acute fracture. No primary bone lesion or focal pathologic process. Soft tissues and spinal canal: No prevertebral fluid or swelling. No visible canal hematoma. Disc levels:  Mild degenerative changes at C6-C7. Upper chest: Questionable tiny foci of extrapleural air at the left lung apex. Other: None IMPRESSION: 1. Negative for acute intracranial hemorrhage. 2. Large left frontal scalp laceration that also involves the soft tissues lateral to the left orbit and the left cheek. Laceration extends down to the left frontal bone superiorly. Suspected nondisplaced linear skull fracture of the left frontal bone deep to the laceration. 3. No acute displaced facial bone fracture is seen 4. Straightening of the cervical spine without fracture identified 5. Questionable tiny foci of extrapleural air at the left lung apex. See dedicated CT chest report. Electronically Signed   By: Jasmine Pang M.D.   On: 10/29/2017 23:22       IMPRESSION:  The patient has been diagnosed with a acetabular fracture of the left hip. The patient is a good candidate for one fraction of postoperative radiation treatment for the prevention of the development of heterotopic ossification.  I have discussed the rationale of this treatment with the patient. I have discussed the possible/expected benefit of such a treatment. I have also discussed the possible side effects and risks of treatment as well. All of the patient's questions have been answered.   PLAN: The patient will undergo simulation and one fraction of external beam radiation treatment. This will be completed to a dose of 7 Gy. This treatment will be completed on  postoperative day #1.       ________________________________   Radene Gunning, MD, PhD   **Disclaimer: This note was dictated with voice recognition software. Similar sounding words can inadvertently be transcribed and this note may contain transcription errors which may not  have been corrected upon publication of note.**

## 2017-11-02 NOTE — Discharge Instructions (Addendum)
Facial Laceration A facial laceration is a cut on the face. These injuries can be painful and cause bleeding. Some cuts may need to be closed with stitches (sutures), skin adhesive strips, or wound glue. Cuts usually heal quickly but can leave a scar. It can take 1-2 years for the scar to go away completely. Follow these instructions at home:  Only take medicines as told by your doctor.  Follow your doctor's instructions for wound care. For Stitches:  Keep the cut clean and dry.  If you have a bandage (dressing), change it at least once a day. Change the bandage if it gets wet or dirty, or as told by your doctor.  Wash the cut with soap and water 2 times a day. Rinse the cut with water. Pat it dry with a clean towel.  Put a thin layer of medicated cream on the cut as told by your doctor.  You may shower after the first 24 hours. Do not soak the cut in water until the stitches are removed.  Have your stitches removed as told by your doctor.  Do not wear any makeup until a few days after your stitches are removed. For Skin Adhesive Strips:  Keep the cut clean and dry.  Do not get the strips wet. You may take a bath, but be careful to keep the cut dry.  If the cut gets wet, pat it dry with a clean towel.  The strips will fall off on their own. Do not remove the strips that are still stuck to the cut. For Wound Glue:  You may shower or take baths. Do not soak or scrub the cut. Do not swim. Avoid heavy sweating until the glue falls off on its own. After a shower or bath, pat the cut dry with a clean towel.  Do not put medicine or makeup on your cut until the glue falls off.  If you have a bandage, do not put tape over the glue.  Avoid lots of sunlight or tanning lamps until the glue falls off.  The glue will fall off on its own in 5-10 days. Do not pick at the glue. After Healing:  Put sunscreen on the cut for the first year to reduce your scar. Contact a doctor if:  You  have a fever. Get help right away if:  Your cut area gets red, painful, or puffy (swollen).  You see a yellowish-white fluid (pus) coming from the cut. This information is not intended to replace advice given to you by your health care provider. Make sure you discuss any questions you have with your health care provider. Document Released: 03/22/2008 Document Revised: 03/11/2016 Document Reviewed: 05/17/2013 Elsevier Interactive Patient Education  2017 [employer] Orthopaedic Trauma Service Discharge Instructions   General Discharge Instructions  WEIGHT BEARING STATUS: touchdown weightbearing Left leg  RANGE OF MOTION/ACTIVITY: posterior hip precautions left hip  Wound Care: wound care starting on 11/04/2017. See below  Discharge Wound Care Instructions  Do NOT apply any ointments, solutions or lotions to pin sites or surgical wounds.  These prevent needed drainage and even though solutions like hydrogen peroxide kill bacteria, they also damage cells lining the pin sites that help fight infection.  Applying lotions or ointments can keep the wounds moist and can cause them to breakdown and open up as well. This can increase the risk for infection. When in doubt call the office.  Surgical incisions should be dressed daily.  If any drainage is noted, use  one layer of adaptic, then gauze, Kerlix, and an ace wrap.  Once the incision is completely dry and without drainage, it may be left open to air out.  Showering may begin 36-48 hours later.  Cleaning gently with soap and water.  Traumatic wounds should be dressed daily as well.    One layer of adaptic, gauze, Kerlix, then ace wrap.  The adaptic can be discontinued once the draining has ceased    If you have a wet to dry dressing: wet the gauze with saline the squeeze as much saline out so the gauze is moist (not soaking wet), place moistened gauze over wound, then place a dry gauze over the moist one, followed by Kerlix wrap, then ace  wrap.    DVT/PE prophylaxis: Lovenox 40 mg subcutaneous injection daily x 4 weeks  Diet: as you were eating previously.  Can use over the counter stool softeners and bowel preparations, such as Miralax, to help with bowel movements.  Narcotics can be constipating.  Be sure to drink plenty of fluids  PAIN MEDICATION USE AND EXPECTATIONS  You have likely been given narcotic medications to help control your pain.  After a traumatic event that results in an fracture (broken bone) with or without surgery, it is ok to use narcotic pain medications to help control one's pain.  We understand that everyone responds to pain differently and each individual patient will be evaluated on a regular basis for the continued need for narcotic medications. Ideally, narcotic medication use should last no more than 6-8 weeks (coinciding with fracture healing).   As a patient it is your responsibility as well to monitor narcotic medication use and report the amount and frequency you use these medications when you come to your office visit.   We would also advise that if you are using narcotic medications, you should take a dose prior to therapy to maximize you participation.  IF YOU ARE ON NARCOTIC MEDICATIONS IT IS NOT PERMISSIBLE TO OPERATE A MOTOR VEHICLE (MOTORCYCLE/CAR/TRUCK/MOPED) OR HEAVY MACHINERY DO NOT MIX NARCOTICS WITH OTHER CNS (CENTRAL NERVOUS SYSTEM) DEPRESSANTS SUCH AS ALCOHOL   STOP SMOKING OR USING NICOTINE PRODUCTS!!!!  As discussed nicotine severely impairs your body's ability to heal surgical and traumatic wounds but also impairs bone healing.  Wounds and bone heal by forming microscopic blood vessels (angiogenesis) and nicotine is a vasoconstrictor (essentially, shrinks blood vessels).  Therefore, if vasoconstriction occurs to these microscopic blood vessels they essentially disappear and are unable to deliver necessary nutrients to the healing tissue.  This is one modifiable factor that you can do  to dramatically increase your chances of healing your injury.    (This means no smoking, no nicotine gum, patches, etc)  DO NOT USE NONSTEROIDAL ANTI-INFLAMMATORY DRUGS (NSAID'S)  Using products such as Advil (ibuprofen), Aleve (naproxen), Motrin (ibuprofen) for additional pain control during fracture healing can delay and/or prevent the healing response.  If you would like to take over the counter (OTC) medication, Tylenol (acetaminophen) is ok.  However, some narcotic medications that are given for pain control contain acetaminophen as well. Therefore, you should not exceed more than 4000 mg of tylenol in a day if you do not have liver disease.  Also note that there are may OTC medicines, such as cold medicines and allergy medicines that my contain tylenol as well.  If you have any questions about medications and/or interactions please ask your doctor/PA or your pharmacist.      ICE AND ELEVATE INJURED/OPERATIVE EXTREMITY  Using ice and elevating the injured extremity above your heart can help with swelling and pain control.  Icing in a pulsatile fashion, such as 20 minutes on and 20 minutes off, can be followed.    Do not place ice directly on skin. Make sure there is a barrier between to skin and the ice pack.    Using frozen items such as frozen peas works well as the conform nicely to the are that needs to be iced.  USE AN ACE WRAP OR TED HOSE FOR SWELLING CONTROL  In addition to icing and elevation, Ace wraps or TED hose are used to help limit and resolve swelling.  It is recommended to use Ace wraps or TED hose until you are informed to stop.    When using Ace Wraps start the wrapping distally (farthest away from the body) and wrap proximally (closer to the body)   Example: If you had surgery on your leg or thing and you do not have a splint on, start the ace wrap at the toes and work your way up to the thigh        If you had surgery on your upper extremity and do not have a splint on, start  the ace wrap at your fingers and work your way up to the upper arm  IF YOU ARE IN A SPLINT OR CAST DO NOT REMOVE IT FOR ANY REASON   If your splint gets wet for any reason please contact the office immediately. You may shower in your splint or cast as long as you keep it dry.  This can be done by wrapping in a cast cover or garbage back (or similar)  Do Not stick any thing down your splint or cast such as pencils, money, or hangers to try and scratch yourself with.  If you feel itchy take benadryl as prescribed on the bottle for itching  IF YOU ARE IN A CAM BOOT (BLACK BOOT)  You may remove boot periodically. Perform daily dressing changes as noted below.  Wash the liner of the boot regularly and wear a sock when wearing the boot. It is recommended that you sleep in the boot until told otherwise  CALL THE OFFICE WITH ANY QUESTIONS OR CONCERNS: [phone]
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
## 2017-11-03 LAB — BASIC METABOLIC PANEL
ANION GAP: 10 (ref 5–15)
BUN: 15 mg/dL (ref 6–20)
CALCIUM: 8.7 mg/dL — AB (ref 8.9–10.3)
CO2: 23 mmol/L (ref 22–32)
CREATININE: 0.99 mg/dL (ref 0.61–1.24)
Chloride: 104 mmol/L (ref 101–111)
Glucose, Bld: 111 mg/dL — ABNORMAL HIGH (ref 65–99)
Potassium: 4 mmol/L (ref 3.5–5.1)
Sodium: 137 mmol/L (ref 135–145)

## 2017-11-03 LAB — CBC
HCT: 29.3 % — ABNORMAL LOW (ref 39.0–52.0)
HEMOGLOBIN: 10.2 g/dL — AB (ref 13.0–17.0)
MCH: 28.3 pg (ref 26.0–34.0)
MCHC: 34.8 g/dL (ref 30.0–36.0)
MCV: 81.4 fL (ref 78.0–100.0)
PLATELETS: 186 10*3/uL (ref 150–400)
RBC: 3.6 MIL/uL — AB (ref 4.22–5.81)
RDW: 13.3 % (ref 11.5–15.5)
WBC: 6.5 10*3/uL (ref 4.0–10.5)

## 2017-11-03 NOTE — Progress Notes (Signed)
Pt discharge education and instructions completed with pt and he voices understanding, denies any questions. Pt IV removed; pt handed his prescriptions for lovenox, percocet and robaxin. Pt DME crutches delivered to pt at bedside; Pt discharge home with family to transport him home. Pt to transported off unit via wheelchair with belongings and family to the side. Dionne BucyP. Amo Milik Gilreath RN
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
## 2017-11-03 NOTE — Progress Notes (Signed)
PT Cancellation Note  Patient Details Name: Cameron Copeland MRN: [id number] DOB: 1981-09-01   Cancelled Treatment:    Reason Eval/Treat Not Completed: Other (comment) checked on pt, he feels he has no PT needs and is ready to go home;    [facility] Hospital[facility]ILLIAMS,Alexie Lanni 11/03/2017, 12:45 PM
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
## 2017-11-08 ENCOUNTER — Encounter: Payer: Self-pay | Admitting: Radiation Oncology

## 2017-11-08 NOTE — Progress Notes (Signed)
  Radiation Oncology         (336) [phone] ________________________________  Name: Purcell NailsMarvin Simerly MRN: [id number]  Date: 11/08/2017  DOB: [birth year]  End of Treatment Note  Diagnosis:  Left acetabular fracture  Indication for treatment:  Curative     Radiation treatment dates:   11/01/2017  Site/dose:   Left hip/ 7Gy  Beams/energy:  Photon/6X  Narrative: The patient tolerated radiation treatment relatively well.    Plan: The patient has completed radiation treatment. The patient will return to radiation oncology clinic for routine followup in one month. I advised them to call or return sooner if they have any questions or concerns related to their recovery or treatment.  ------------------------------------------------  Radene GunningJohn S. Halina Asano, MD, PhD  This document serves as a record of services personally performed by Dorothy PufferJohn Julie Nay, MD. It was created on his behalf by Tor Netterslifflena Tiah, a trained medical scribe. The creation of this record is based on the scribe's personal observations and the provider's statements to them. This document has been checked and approved by the attending provider.

## 2017-11-10 NOTE — Progress Notes (Signed)
  Radiation Oncology         (336) [phone] ________________________________  Name: Cameron Copeland MRN: [id number]  Date: 11/01/2017  DOB: September 11, 1981  SIMULATION AND TREATMENT PLANNING NOTE  DIAGNOSIS:     ICD-10-CM   1. 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  Identity was confirmed.  All relevant records and images related to the planned course of therapy were reviewed.   Written consent to proceed with treatment was confirmed which was freely given after reviewing the details related to the planned course of therapy had been reviewed with the patient.  Then, the patient was set-up in a stable reproducible supine position for radiation therapy.  Xrays were obtained of the target hip area.     The images were reviewed and 2 treatment fields were designed and positioned to treat the appropriate target region. A simple isodose plan is requested.  PLAN:  The patient will receive 7 Gy in 1 fraction.    Simulation verification Port films were taken prior to treatment. Each of the treatment fields was reviewed and appropriately delineates the target regions and the patient was appropriate to proceed with radiation treatment.  ________________________________   Radene GunningJohn S. Alaja Goldinger, MD, PhD

## 2017-11-10 NOTE — Addendum Note (Signed)
Encounter addended by: Dorothy PufferMoody, Andee Chivers, MD on: 11/10/2017 1:07 PM  Actions taken: Sign clinical note
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
